# Patient Record
Sex: Female | Born: 1955 | Race: White | Hispanic: No | Marital: Married | Smoking: Never smoker
Health system: Southern US, Community
[De-identification: ages and names within clinical notes are randomized; demographics above are authoritative.]

## PROBLEM LIST (undated history)

## (undated) DIAGNOSIS — F329 Major depressive disorder, single episode, unspecified: Secondary | ICD-10-CM

## (undated) DIAGNOSIS — F32A Depression, unspecified: Secondary | ICD-10-CM

## (undated) HISTORY — PX: TONSILLECTOMY: SUR1361

## (undated) HISTORY — PX: WISDOM TOOTH EXTRACTION: SHX21

## (undated) HISTORY — PX: OOPHORECTOMY: SHX86

---

## 1998-04-04 ENCOUNTER — Encounter: Payer: Self-pay | Admitting: Internal Medicine

## 2003-02-08 ENCOUNTER — Other Ambulatory Visit: Admission: RE | Admit: 2003-02-08 | Discharge: 2003-02-08 | Payer: Self-pay | Admitting: Internal Medicine

## 2004-12-24 ENCOUNTER — Ambulatory Visit: Payer: Self-pay | Admitting: Internal Medicine

## 2005-01-26 ENCOUNTER — Ambulatory Visit: Payer: Self-pay | Admitting: Internal Medicine

## 2005-06-25 ENCOUNTER — Ambulatory Visit: Payer: Self-pay | Admitting: Internal Medicine

## 2005-12-23 ENCOUNTER — Other Ambulatory Visit: Admission: RE | Admit: 2005-12-23 | Discharge: 2005-12-23 | Payer: Self-pay | Admitting: Internal Medicine

## 2005-12-23 ENCOUNTER — Ambulatory Visit: Payer: Self-pay | Admitting: Internal Medicine

## 2005-12-31 ENCOUNTER — Encounter: Admission: RE | Admit: 2005-12-31 | Discharge: 2005-12-31 | Payer: Self-pay | Admitting: Internal Medicine

## 2006-01-22 ENCOUNTER — Ambulatory Visit: Payer: Self-pay | Admitting: Internal Medicine

## 2006-03-05 ENCOUNTER — Ambulatory Visit: Payer: Self-pay | Admitting: Internal Medicine

## 2006-07-05 ENCOUNTER — Ambulatory Visit: Payer: Self-pay | Admitting: Internal Medicine

## 2006-09-09 ENCOUNTER — Ambulatory Visit: Payer: Self-pay | Admitting: Internal Medicine

## 2007-01-05 ENCOUNTER — Ambulatory Visit: Payer: Self-pay | Admitting: Internal Medicine

## 2007-01-05 ENCOUNTER — Encounter: Payer: Self-pay | Admitting: Internal Medicine

## 2007-01-05 DIAGNOSIS — D649 Anemia, unspecified: Secondary | ICD-10-CM

## 2007-01-05 DIAGNOSIS — K219 Gastro-esophageal reflux disease without esophagitis: Secondary | ICD-10-CM

## 2007-01-05 DIAGNOSIS — R002 Palpitations: Secondary | ICD-10-CM

## 2007-01-05 DIAGNOSIS — J309 Allergic rhinitis, unspecified: Secondary | ICD-10-CM | POA: Insufficient documentation

## 2007-01-05 DIAGNOSIS — F329 Major depressive disorder, single episode, unspecified: Secondary | ICD-10-CM

## 2007-01-05 LAB — CONVERTED CEMR LAB
ALT: 11 units/L (ref 0–40)
AST: 13 units/L (ref 0–37)
Alkaline Phosphatase: 63 units/L (ref 39–117)
Basophils Relative: 1 % (ref 0.0–1.0)
Bilirubin, Direct: 0.1 mg/dL (ref 0.0–0.3)
Chloride: 108 meq/L (ref 96–112)
Creatinine, Ser: 0.9 mg/dL (ref 0.4–1.2)
Eosinophils Relative: 2.7 % (ref 0.0–5.0)
GFR calc Af Amer: 86 mL/min
GFR calc non Af Amer: 71 mL/min
Lymphocytes Relative: 20.1 % (ref 12.0–46.0)
MCHC: 34.9 g/dL (ref 30.0–36.0)
MCV: 79.5 fL (ref 78.0–100.0)
Phosphorus: 3.3 mg/dL (ref 2.3–4.6)
Platelets: 294 10*3/uL (ref 150–400)
Potassium: 4.4 meq/L (ref 3.5–5.1)
RDW: 13.7 % (ref 11.5–14.6)
Total Bilirubin: 0.7 mg/dL (ref 0.3–1.2)
WBC: 6.6 10*3/uL (ref 4.5–10.5)

## 2007-01-06 ENCOUNTER — Encounter: Admission: RE | Admit: 2007-01-06 | Discharge: 2007-01-06 | Payer: Self-pay | Admitting: Internal Medicine

## 2007-03-18 ENCOUNTER — Telehealth: Payer: Self-pay | Admitting: Internal Medicine

## 2007-06-13 ENCOUNTER — Encounter: Payer: Self-pay | Admitting: Internal Medicine

## 2007-07-07 ENCOUNTER — Ambulatory Visit: Payer: Self-pay | Admitting: Internal Medicine

## 2007-07-07 DIAGNOSIS — R5381 Other malaise: Secondary | ICD-10-CM

## 2007-07-07 DIAGNOSIS — J019 Acute sinusitis, unspecified: Secondary | ICD-10-CM

## 2007-07-07 DIAGNOSIS — R5383 Other fatigue: Secondary | ICD-10-CM

## 2007-07-08 ENCOUNTER — Ambulatory Visit: Payer: Self-pay | Admitting: Oncology

## 2007-07-08 LAB — CONVERTED CEMR LAB
Basophils Absolute: 0 10*3/uL (ref 0.0–0.1)
Basophils Relative: 0.6 % (ref 0.0–1.0)
CO2: 28 meq/L (ref 19–32)
Calcium: 8.9 mg/dL (ref 8.4–10.5)
Eosinophils Absolute: 0.1 10*3/uL (ref 0.0–0.6)
Eosinophils Relative: 2.5 % (ref 0.0–5.0)
Ferritin: 4 ng/mL — ABNORMAL LOW (ref 10.0–291.0)
GFR calc Af Amer: 98 mL/min
HCT: 32.6 % — ABNORMAL LOW (ref 36.0–46.0)
Hemoglobin: 10.9 g/dL — ABNORMAL LOW (ref 12.0–15.0)
Iron: 32 ug/dL — ABNORMAL LOW (ref 42–145)
Monocytes Absolute: 0.4 10*3/uL (ref 0.2–0.7)
Phosphorus: 3.5 mg/dL (ref 2.3–4.6)
Potassium: 4.3 meq/L (ref 3.5–5.1)
RBC: 4.18 M/uL (ref 3.87–5.11)
Sodium: 141 meq/L (ref 135–145)
TSH: 0.82 microintl units/mL (ref 0.35–5.50)
Total Protein: 6.9 g/dL (ref 6.0–8.3)
Transferrin: 289.3 mg/dL (ref >=212.0)
WBC: 5.9 10*3/uL (ref 4.5–10.5)

## 2007-07-25 ENCOUNTER — Encounter: Payer: Self-pay | Admitting: Internal Medicine

## 2007-07-25 LAB — CBC WITH DIFFERENTIAL (CANCER CENTER ONLY)
BASO%: 0.3 % (ref 0.0–2.0)
EOS%: 3.3 % (ref 0.0–7.0)
LYMPH#: 1.5 10*3/uL (ref 0.9–3.3)
LYMPH%: 27 % (ref 14.0–48.0)
MCHC: 32.9 g/dL (ref 32.0–36.0)
MONO#: 0.3 10*3/uL (ref 0.1–0.9)
NEUT%: 64.1 % (ref 39.6–80.0)
Platelets: 347 10*3/uL (ref 145–400)
RDW: 14.8 % — ABNORMAL HIGH (ref 10.5–14.6)
WBC: 5.4 10*3/uL (ref 3.9–10.0)

## 2007-07-25 LAB — COMPREHENSIVE METABOLIC PANEL
ALT: 12 U/L (ref 0–35)
AST: 13 U/L (ref 0–37)
Albumin: 3.8 g/dL (ref 3.5–5.2)
Alkaline Phosphatase: 59 U/L (ref 39–117)
BUN: 8 mg/dL (ref 6–23)
CO2: 27 mEq/L (ref 19–32)
Calcium: 8.9 mg/dL (ref 8.4–10.5)
Chloride: 108 mEq/L (ref 96–112)
Creatinine, Ser: 0.83 mg/dL (ref 0.40–1.20)
Glucose, Bld: 85 mg/dL (ref 70–99)
Potassium: 4.1 mEq/L (ref 3.5–5.3)
Sodium: 138 mEq/L (ref 135–145)
Total Bilirubin: 0.4 mg/dL (ref 0.3–1.2)
Total Protein: 6.6 g/dL (ref 6.0–8.3)

## 2007-07-25 LAB — MORPHOLOGY - CHCC SATELLITE
PLT EST ~~LOC~~: ADEQUATE
Platelet Morphology: NORMAL

## 2007-07-25 LAB — RETICULOCYTES (CHCC)
ABS Retic: 35 10*3/uL (ref 19.0–186.0)
RBC.: 4.37 MIL/uL (ref 3.87–5.11)
Retic Ct Pct: 0.8 % (ref 0.4–3.1)

## 2007-07-26 ENCOUNTER — Other Ambulatory Visit: Admission: RE | Admit: 2007-07-26 | Discharge: 2007-07-26 | Payer: Self-pay | Admitting: Obstetrics and Gynecology

## 2007-07-27 LAB — IRON AND TIBC
%SAT: 6 % — ABNORMAL LOW (ref 20–55)
Iron: 24 ug/dL — ABNORMAL LOW (ref 42–145)
TIBC: 400 ug/dL (ref 250–470)

## 2007-07-27 LAB — PROTEIN ELECTROPHORESIS, SERUM
Albumin ELP: 56.9 % (ref 55.8–66.1)
Alpha-2-Globulin: 10.7 % (ref 7.1–11.8)
Beta Globulin: 6.5 % (ref 4.7–7.2)
Gamma Globulin: 16.6 % (ref 11.1–18.8)
Total Protein, Serum Electrophoresis: 7.3 g/dL (ref 6.0–8.3)

## 2007-07-27 LAB — VITAMIN B12: Vitamin B-12: 278 pg/mL (ref 211–911)

## 2007-07-27 LAB — FERRITIN: Ferritin: 5 ng/mL — ABNORMAL LOW (ref 10–291)

## 2007-07-27 LAB — HEMOGLOBINOPATHY EVALUATION
Hemoglobin Other: 0 % (ref 0.0–0.0)
Hgb A2 Quant: 3 % (ref 2.2–3.2)
Hgb A: 97 % (ref 96.8–97.8)
Hgb S Quant: 0 % (ref 0.0–0.0)

## 2007-07-27 LAB — ERYTHROPOIETIN: Erythropoietin: 19.4 m[IU]/mL (ref 2.6–34.0)

## 2007-07-29 ENCOUNTER — Encounter: Payer: Self-pay | Admitting: Internal Medicine

## 2007-08-08 LAB — CBC WITH DIFFERENTIAL (CANCER CENTER ONLY)
BASO%: 0.4 % (ref 0.0–2.0)
EOS%: 3 % (ref 0.0–7.0)
HCT: 31.6 % — ABNORMAL LOW (ref 34.8–46.6)
LYMPH#: 1.5 10*3/uL (ref 0.9–3.3)
MCHC: 33.4 g/dL (ref 32.0–36.0)
MONO#: 0.4 10*3/uL (ref 0.1–0.9)
NEUT#: 4.5 10*3/uL (ref 1.5–6.5)
NEUT%: 68.7 % (ref 39.6–80.0)
RDW: 14 % (ref 10.5–14.6)
WBC: 6.5 10*3/uL (ref 3.9–10.0)

## 2007-08-19 ENCOUNTER — Ambulatory Visit: Payer: Self-pay | Admitting: Oncology

## 2007-09-20 ENCOUNTER — Encounter: Payer: Self-pay | Admitting: Internal Medicine

## 2007-09-20 LAB — IRON AND TIBC
%SAT: 30 % (ref 20–55)
Iron: 88 ug/dL (ref 42–145)
UIBC: 206 ug/dL

## 2007-09-20 LAB — RETICULOCYTES (CHCC)
ABS Retic: 39.4 10*3/uL (ref 19.0–186.0)
RBC.: 4.92 MIL/uL (ref 3.87–5.11)
Retic Ct Pct: 0.8 % (ref 0.4–3.1)

## 2007-09-20 LAB — CBC WITH DIFFERENTIAL (CANCER CENTER ONLY)
BASO%: 0.3 % (ref 0.0–2.0)
HCT: 38.2 % (ref 34.8–46.6)
LYMPH%: 23.7 % (ref 14.0–48.0)
MCH: 27.8 pg (ref 26.0–34.0)
MCHC: 34.3 g/dL (ref 32.0–36.0)
MCV: 81 fL (ref 81–101)
MONO%: 5.5 % (ref 0.0–13.0)
NEUT%: 67.6 % (ref 39.6–80.0)
Platelets: 320 10*3/uL (ref 145–400)
RDW: 19.7 % — ABNORMAL HIGH (ref 10.5–14.6)

## 2007-12-22 ENCOUNTER — Ambulatory Visit: Payer: Self-pay | Admitting: Oncology

## 2007-12-27 ENCOUNTER — Encounter: Payer: Self-pay | Admitting: Internal Medicine

## 2007-12-27 LAB — CBC WITH DIFFERENTIAL (CANCER CENTER ONLY)
BASO#: 0 10*3/uL (ref 0.0–0.2)
BASO%: 0.4 % (ref 0.0–2.0)
EOS%: 2.9 % (ref 0.0–7.0)
HCT: 37.8 % (ref 34.8–46.6)
LYMPH#: 1.1 10*3/uL (ref 0.9–3.3)
MCH: 29.3 pg (ref 26.0–34.0)
MCV: 86 fL (ref 81–101)
NEUT#: 3.4 10*3/uL (ref 1.5–6.5)
NEUT%: 69.5 % (ref 39.6–80.0)
RBC: 4.38 10*6/uL (ref 3.70–5.32)
WBC: 4.8 10*3/uL (ref 3.9–10.0)

## 2007-12-27 LAB — IRON AND TIBC
Iron: 101 ug/dL (ref 42–145)
TIBC: 304 ug/dL (ref 250–470)
UIBC: 203 ug/dL

## 2007-12-27 LAB — RETICULOCYTES (CHCC)
ABS Retic: 44 10*3/uL (ref 19.0–186.0)
RBC.: 4.4 MIL/uL (ref 3.87–5.11)
Retic Ct Pct: 1 % (ref 0.4–3.1)

## 2008-03-16 ENCOUNTER — Ambulatory Visit: Payer: Self-pay | Admitting: Oncology

## 2008-03-22 LAB — CBC WITH DIFFERENTIAL (CANCER CENTER ONLY)
EOS%: 3.2 % (ref 0.0–7.0)
Eosinophils Absolute: 0.2 10*3/uL (ref 0.0–0.5)
HCT: 36.9 % (ref 34.8–46.6)
LYMPH%: 22.6 % (ref 14.0–48.0)
MCHC: 33.7 g/dL (ref 32.0–36.0)
MONO#: 0.4 10*3/uL (ref 0.1–0.9)
MONO%: 6.3 % (ref 0.0–13.0)
NEUT#: 3.7 10*3/uL (ref 1.5–6.5)
Platelets: 308 10*3/uL (ref 145–400)
WBC: 5.5 10*3/uL (ref 3.9–10.0)

## 2008-03-22 LAB — IRON AND TIBC
%SAT: 13 % — ABNORMAL LOW (ref 20–55)
Iron: 41 ug/dL — ABNORMAL LOW (ref 42–145)
TIBC: 318 ug/dL (ref 250–470)

## 2008-03-22 LAB — FERRITIN: Ferritin: 15 ng/mL (ref 10–291)

## 2008-03-27 ENCOUNTER — Encounter: Payer: Self-pay | Admitting: Internal Medicine

## 2008-04-09 ENCOUNTER — Telehealth (INDEPENDENT_AMBULATORY_CARE_PROVIDER_SITE_OTHER): Payer: Self-pay | Admitting: *Deleted

## 2008-04-18 ENCOUNTER — Encounter: Payer: Self-pay | Admitting: Internal Medicine

## 2008-04-27 LAB — CBC WITH DIFFERENTIAL (CANCER CENTER ONLY)
BASO%: 0.4 % (ref 0.0–2.0)
HGB: 12 g/dL (ref 11.6–15.9)
LYMPH%: 21 % (ref 14.0–48.0)
MCHC: 34.2 g/dL (ref 32.0–36.0)
MONO#: 0.3 10*3/uL (ref 0.1–0.9)
NEUT%: 70.8 % (ref 39.6–80.0)
RBC: 4.21 10*6/uL (ref 3.70–5.32)
RDW: 12.7 % (ref 10.5–14.6)
WBC: 5.9 10*3/uL (ref 3.9–10.0)

## 2008-04-27 LAB — FERRITIN: Ferritin: 10 ng/mL (ref 10–291)

## 2008-04-27 LAB — IRON AND TIBC
%SAT: 17 % — ABNORMAL LOW (ref 20–55)
UIBC: 268 ug/dL

## 2008-05-09 ENCOUNTER — Telehealth: Payer: Self-pay | Admitting: Internal Medicine

## 2008-06-19 ENCOUNTER — Ambulatory Visit: Payer: Self-pay | Admitting: Oncology

## 2008-06-25 ENCOUNTER — Encounter: Payer: Self-pay | Admitting: Internal Medicine

## 2008-06-25 LAB — CBC WITH DIFFERENTIAL (CANCER CENTER ONLY)
BASO#: 0 10*3/uL (ref 0.0–0.2)
BASO%: 0.3 % (ref 0.0–2.0)
EOS%: 3.1 % (ref 0.0–7.0)
Eosinophils Absolute: 0.2 10*3/uL (ref 0.0–0.5)
LYMPH#: 1.2 10*3/uL (ref 0.9–3.3)
LYMPH%: 21.8 % (ref 14.0–48.0)
MCHC: 34.2 g/dL (ref 32.0–36.0)
MONO#: 0.3 10*3/uL (ref 0.1–0.9)
MONO%: 5.5 % (ref 0.0–13.0)
NEUT#: 3.9 10*3/uL (ref 1.5–6.5)
NEUT%: 69.3 % (ref 39.6–80.0)
Platelets: 244 10*3/uL (ref 145–400)
RDW: 12.4 % (ref 10.5–14.6)

## 2008-06-25 LAB — IRON AND TIBC: UIBC: 275 ug/dL

## 2008-09-06 ENCOUNTER — Ambulatory Visit: Payer: Self-pay | Admitting: Oncology

## 2008-09-07 LAB — CBC WITH DIFFERENTIAL (CANCER CENTER ONLY)
Eosinophils Absolute: 0.2 10*3/uL (ref 0.0–0.5)
HCT: 34.1 % — ABNORMAL LOW (ref 34.8–46.6)
HGB: 11.2 g/dL — ABNORMAL LOW (ref 11.6–15.9)
LYMPH#: 1.1 10*3/uL (ref 0.9–3.3)
LYMPH%: 21.7 % (ref 14.0–48.0)
MONO%: 5.7 % (ref 0.0–13.0)
NEUT#: 3.6 10*3/uL (ref 1.5–6.5)
NEUT%: 68.7 % (ref 39.6–80.0)
Platelets: 337 10*3/uL (ref 145–400)
RDW: 13 % (ref 10.5–14.6)

## 2008-09-07 LAB — IRON AND TIBC
Iron: 152 ug/dL — ABNORMAL HIGH (ref 42–145)
TIBC: 365 ug/dL (ref 250–470)

## 2008-10-01 ENCOUNTER — Encounter (INDEPENDENT_AMBULATORY_CARE_PROVIDER_SITE_OTHER): Payer: Self-pay | Admitting: Obstetrics and Gynecology

## 2008-10-01 ENCOUNTER — Ambulatory Visit (HOSPITAL_COMMUNITY): Admission: RE | Admit: 2008-10-01 | Discharge: 2008-10-01 | Payer: Self-pay | Admitting: Obstetrics and Gynecology

## 2008-10-09 ENCOUNTER — Encounter: Payer: Self-pay | Admitting: Internal Medicine

## 2008-10-22 ENCOUNTER — Telehealth: Payer: Self-pay | Admitting: Internal Medicine

## 2008-10-30 ENCOUNTER — Ambulatory Visit: Payer: Self-pay | Admitting: Internal Medicine

## 2008-12-25 ENCOUNTER — Telehealth: Payer: Self-pay | Admitting: Internal Medicine

## 2009-01-03 ENCOUNTER — Ambulatory Visit: Payer: Self-pay | Admitting: Oncology

## 2009-01-07 ENCOUNTER — Encounter: Payer: Self-pay | Admitting: Internal Medicine

## 2009-01-07 LAB — CBC WITH DIFFERENTIAL (CANCER CENTER ONLY)
BASO#: 0.1 10*3/uL (ref 0.0–0.2)
BASO%: 0.7 % (ref 0.0–2.0)
HGB: 12.9 g/dL (ref 11.6–15.9)
LYMPH%: 23.8 % (ref 14.0–48.0)
MCHC: 34.1 g/dL (ref 32.0–36.0)
MONO#: 0.4 10*3/uL (ref 0.1–0.9)
NEUT#: 4.5 10*3/uL (ref 1.5–6.5)
RDW: 13.7 % (ref 10.5–14.6)

## 2009-01-07 LAB — IRON AND TIBC
TIBC: 318 ug/dL (ref 250–470)
UIBC: 247 ug/dL

## 2009-02-28 ENCOUNTER — Ambulatory Visit: Payer: Self-pay | Admitting: Internal Medicine

## 2009-03-12 ENCOUNTER — Ambulatory Visit: Payer: Self-pay | Admitting: Internal Medicine

## 2009-03-13 LAB — CONVERTED CEMR LAB
HDL: 59.2 mg/dL (ref >39.00)
Total CHOL/HDL Ratio: 3

## 2010-04-24 ENCOUNTER — Telehealth: Payer: Self-pay | Admitting: Family Medicine

## 2010-10-21 NOTE — Progress Notes (Signed)
Summary: Rx Prozac  Phone Note Refill Request Call back at [phone] Message from:  [facility] on April 24, 2010 11:17 AM  Refills Requested: Medication #1:  PROZAC 20 MG  CAPS Take one by mouth once a day   Last Refilled: 03/25/2010 Patient has not been seen in over a year, no appointment scheduled. Pharmacy has been notified on the last 2 refill request that patient needs to be seen for further refills.   Method Requested: Electronic Initial call taken by: Sydell Axon LPN,  April 24, 2010 11:19 AM  Follow-up for Phone Call        denied.  Needs to be seen. Ruthe Mannan MD  April 24, 2010 11:23 AM   Additional Follow-up for Phone Call Additional follow up Details #1::        Pharmacy notified as instructed. Additional Follow-up by: Sydell Axon LPN,  April 24, 2010 11:53 AM

## 2011-01-05 LAB — CBC
HCT: 40 % (ref 36.0–46.0)
Hemoglobin: 13 g/dL (ref 12.0–15.0)
MCV: 85 fL (ref 78.0–100.0)
RBC: 4.7 MIL/uL (ref 3.87–5.11)

## 2011-01-05 LAB — PREGNANCY, URINE: Preg Test, Ur: NEGATIVE

## 2011-02-03 NOTE — Op Note (Signed)
NAME:  Ashley Little, Ashley Little                ACCOUNT NO.:  [id number]   MEDICAL RECORD NO.:  [id number]          PATIENT TYPE:  AMB   LOCATION:  SDC                           FACILITY:  [facility]   PHYSICIAN:  Fermin Schwab, MD   DATE OF BIRTH:  19-Apr-1956   DATE OF PROCEDURE:  DATE OF DISCHARGE:  10/01/2008                               OPERATIVE REPORT   PREOPERATIVE DIAGNOSIS:  Submucosal myoma, menometrorrhagia.   POSTOPERATIVE DIAGNOSIS:  Large type 1 submucosal myoma,  menometrorrhagia.   PROCEDURES:  Hysteroscopy, resection of submucosal myoma, suction  dilatation and curettage.   SURGEON:  Fermin Schwab, MD   ANESTHESIA:  General endotracheal.   ESTIMATED BLOOD LOSS:  Less than 20 mL.   FLUID DEFICIT:  A 375 mL of 3% sorbitol.   FINDINGS:  Uterus sounded to 9 cm.  Endocervical canal and visible  portion of the endometrial cavity appeared normal.  There was menstrual  endometrium in the cavity.  Initially, a fundal myoma was noted to be  filling up to about two-thirds of the endometrial cavity.  As the  decompression of the cavity occurred with the continued resection, the  ostial regions of the cavity could be visualized.  The 2.8 x 27 cm type  1 submucosal myoma was noted to be attached to the fundal and posterior  aspect of the cavity.   DESCRIPTION OF PROCEDURE:  The patient was placed in dorsal supine  position.  General endotracheal anesthesia was given.  She was prepped  and draped in lithotomy position.  A 2 g of cefazolin was given  intravenously for prophylaxis.  The bladder was catheterized.  A vaginal  speculum was inserted and dilute vasopressin solution (0.4 units/mL) was  injected x8 mL.  First, a 3% solution of sorbitol via a dedicated  hysteroscopic fluid management system was used for distention.  Initially, a slender GYN hysteroscope was used for the diagnostic  hysteroscopy and above findings were noted.  Because of fluffy nature of  the menstrual  endometrium, it was necessary to perform suction D&C for  better evaluation of submucosal myoma to be resected.  This was done  with a size 8 curette.  The cervix was then dilated to 29-French with  Hegar dilators.  First, the slender hysteroscope, then the standard 9-mm  hysteroscope was used.  Using 90 watts cutting current on a loop  electrode, the myoma was carefully resected.  As the cavity was crowded  with fragments of fibroids, these were evacuated and the rest of the  myoma was shaved down to the condition that was level with rest of the  endometrium.  Now, the fundal endometrium could be better visualized and  the tubal ostia was visualized at this point for the first time.   Hemostasis was checked.  The instruments count was correct.  All  instruments were removed.  The patient tolerated the procedure well and  was transferred to PACU in satisfactory condition.      Fermin Schwab, MD  Electronically Signed     TY/MEDQ  D:  10/03/2008  T:  10/04/2008  Job:  [id number]   cc:   Artist Pais, M.D.  Fax: [phone]

## 2016-05-11 ENCOUNTER — Other Ambulatory Visit: Payer: Self-pay | Admitting: Internal Medicine

## 2016-05-11 DIAGNOSIS — Z1231 Encounter for screening mammogram for malignant neoplasm of breast: Secondary | ICD-10-CM

## 2016-05-15 ENCOUNTER — Ambulatory Visit
Admission: RE | Admit: 2016-05-15 | Discharge: 2016-05-15 | Disposition: A | Discharge status: Home / Self Care | Payer: No Typology Code available for payment source | Source: Ambulatory Visit | Attending: Internal Medicine | Admitting: Internal Medicine

## 2016-05-15 DIAGNOSIS — Z1231 Encounter for screening mammogram for malignant neoplasm of breast: Secondary | ICD-10-CM

## 2017-02-02 ENCOUNTER — Ambulatory Visit (INDEPENDENT_AMBULATORY_CARE_PROVIDER_SITE_OTHER): Payer: Self-pay

## 2017-02-02 ENCOUNTER — Encounter (HOSPITAL_COMMUNITY): Payer: Self-pay | Admitting: Emergency Medicine

## 2017-02-02 ENCOUNTER — Ambulatory Visit (HOSPITAL_COMMUNITY)
Admission: EM | Admit: 2017-02-02 | Discharge: 2017-02-02 | Disposition: A | Discharge status: Home / Self Care | Payer: No Typology Code available for payment source

## 2017-02-02 DIAGNOSIS — M25572 Pain in left ankle and joints of left foot: Secondary | ICD-10-CM

## 2017-02-02 DIAGNOSIS — S93602A Unspecified sprain of left foot, initial encounter: Secondary | ICD-10-CM

## 2017-02-02 HISTORY — DX: Depression, unspecified: F32.A

## 2017-02-02 HISTORY — DX: Major depressive disorder, single episode, unspecified: F32.9

## 2017-02-02 NOTE — Discharge Instructions (Addendum)
Rest,ice,ace wrap, elevate,crutches. Your xrays were negative for fracture. Please follow up with Orthopedics if pain/swelling persist. May take ibuprofen 400 mg every 8 hours x 3 days for pain/swelling.

## 2017-02-02 NOTE — ED Triage Notes (Signed)
[facility] a [facility] of left ankle pain and swelling x 3 weeks. The patient denied any known injury.

## 2017-02-02 NOTE — ED Provider Notes (Signed)
CSN: [id number]     Arrival date & time 02/02/17  1738 History   None    Chief Complaint  Patient presents with  . Ankle Pain   (Consider location/radiation/quality/duration/timing/severity/associated sxs/prior Treatment) The history is provided by the patient. No language interpreter was used.  Ankle Pain  Location:  Ankle and foot Time since incident:  3 weeks Lower extremity injury: unknown, "heard a pop" after rotating foot.   Ankle location:  L ankle Foot location:  L foot Pain details:    Quality:  Aching, pressure and throbbing   Radiates to:  Does not radiate   Severity:  Moderate   Onset quality:  Unable to specify   Timing:  Intermittent   Progression:  Waxing and waning Chronicity:  New Dislocation: no   Foreign body present:  No foreign bodies Tetanus status:  Up to date Prior injury to area:  No Relieved by:  Ice, rest and elevation Worsened by:  Activity Associated symptoms: swelling   Associated symptoms: no fever     Past Medical History:  Diagnosis Date  . Depression    Past Surgical History:  Procedure Laterality Date  . OOPHORECTOMY Right    History reviewed. No pertinent family history. Social History  Substance Use Topics  . Smoking status: Never Smoker  . Smokeless tobacco: Never Used  . Alcohol use No   OB History    No data available     Review of Systems  Constitutional: Negative for fever.  Musculoskeletal: Positive for arthralgias and joint swelling.  All other systems reviewed and are negative.   Allergies  Sulfonamide derivatives  Home Medications   Prior to Admission medications   Medication Sig Start Date End Date Taking? Authorizing Provider  FLUoxetine (PROZAC) 10 MG tablet Take 10 mg by mouth daily.    [provider]   Meds Ordered and Administered this Visit  Medications - No data to display  BP 127/67 (BP Location: Right Arm)   Pulse 77   Temp 98.7 F (37.1 C) (Oral)   Resp 18   SpO2 97%  No data  found.   Physical Exam  Constitutional: She is oriented to person, place, and time. Vital signs are normal. She appears well-developed and well-nourished. She is active and cooperative.  Non-toxic appearance. She does not have a sickly appearance. She does not appear ill. No distress.  HENT:  Head: Normocephalic.  Right Ear: Tympanic membrane normal.  Left Ear: Tympanic membrane normal.  Nose: Nose normal.  Mouth/Throat: Uvula is midline and mucous membranes are normal.  Eyes: Pupils are equal, round, and reactive to light.  Neck: Trachea normal and normal range of motion. Muscular tenderness present. No Brudzinski's sign and no Kernig's sign noted.  Cardiovascular: Normal rate and regular rhythm.   Pulses:      Dorsalis pedis pulses are 2+ on the right side, and 2+ on the left side.  Pulmonary/Chest: Effort normal and breath sounds normal.  Musculoskeletal:       Right shoulder: She exhibits normal range of motion, no tenderness, no bony tenderness, no swelling, no effusion, no crepitus, no deformity, no laceration, no pain, normal pulse and normal strength.       Left ankle: She exhibits swelling. She exhibits normal pulse. Tenderness. Lateral malleolus tenderness found.       Left foot: There is tenderness, bony tenderness and swelling. There is normal range of motion, normal capillary refill, no crepitus, no deformity and no laceration.  Neurological: She is  alert and oriented to person, place, and time. No cranial nerve deficit or sensory deficit. GCS eye subscore is 4. GCS verbal subscore is 5. GCS motor subscore is 6.  Skin: Skin is warm and dry. No rash noted.  Psychiatric: She has a normal mood and affect. Her speech is normal and behavior is normal.  Nursing note and vitals reviewed.   Urgent Care Course     Procedures (including critical care time)  Labs Review Labs Reviewed - No data to display  Imaging Review Dg Ankle Complete Left  Result Date: 02/02/2017 CLINICAL  DATA:  Left foot and ankle pain and swelling for 1 month. No reported injury. EXAM: LEFT ANKLE COMPLETE - 3+ VIEW COMPARISON:  None. FINDINGS: Mild diffuse left ankle soft tissue swelling. No fracture or subluxation. No suspicious focal osseous lesion. Small Achilles and moderate plantar left calcaneal spurs. No significant arthropathy. No radiopaque foreign body. IMPRESSION: 1. Mild diffuse left ankle soft tissue swelling. No fracture or subluxation in the left ankle. 2. Small Achilles and moderate plantar left calcaneal spurs. Electronically Signed   By: Delbert PhenixJason A Poff M.D.   On: 02/02/2017 18:30   Dg Foot Complete Left  Result Date: 02/02/2017 CLINICAL DATA:  Left foot and ankle pain and swelling for 1 month. No reported injury. EXAM: LEFT FOOT - COMPLETE 3+ VIEW COMPARISON:  None. FINDINGS: Cerclage wire overlies the proximal aspect of the proximal phalanx in the left first toe. Mild hallux valgus deformity. Mild well corticated deformity in the medial distal left first metatarsal with overlying soft tissue swelling suggests postsurgical changes from prior bunionectomy. No fracture or dislocation. No suspicious focal osseous lesion. 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 Small Achilles and moderate plantar left calcaneal spurs. IMPRESSION: 1. No left foot fracture or dislocation . 2. Mild hallux valgus deformity. Well corticated deformity in the medial distal left first metatarsal with overlying soft tissue swelling, correlate for history of prior bunionectomy. Electronically Signed   By: Delbert PhenixJason A Poff M.D.   On: 02/02/2017 18:32         MDM   1. Acute left ankle pain   2. Sprain of left foot, initial encounter      1810: left foot/ankle xray ordered  Rest,ice,ace wrap, elevate,crutches. Your xrays were negative for fracture. Please follow up with Orthopedics if pain/swelling persist. May take ibuprofen 400 mg every 8 hours x 3 days for pain/swelling. Pt verbalized  understanding to this provider. Ace wrap applied in UC, NV intact pre/post application, crutches with instructions given.    Clancy Gourdefelice, Eshaan Titzer, NP 02/02/17 [phone]

## 2018-06-30 ENCOUNTER — Encounter (HOSPITAL_COMMUNITY): Payer: Self-pay | Admitting: Emergency Medicine

## 2018-06-30 ENCOUNTER — Ambulatory Visit (HOSPITAL_COMMUNITY)
Admission: EM | Admit: 2018-06-30 | Discharge: 2018-06-30 | Disposition: A | Discharge status: Home / Self Care | Payer: Managed Care, Other (non HMO) | Attending: Family Medicine | Admitting: Family Medicine

## 2018-06-30 DIAGNOSIS — R079 Chest pain, unspecified: Secondary | ICD-10-CM | POA: Diagnosis not present

## 2018-06-30 MED ORDER — OMEPRAZOLE 20 MG PO CPDR: 20.0000 mg | DELAYED_RELEASE_CAPSULE | Freq: Every day | ORAL | 0 refills | Status: AC

## 2018-06-30 NOTE — ED Provider Notes (Addendum)
[facility]    CSN: [id number] Arrival date & time: 06/30/18  1303     History   Chief Complaint Chief Complaint  Patient presents with  . Chest Pain    HPI Ashley Little is a 62 y.o. female history of depression presenting today for evaluation of chest pain.  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  Feels like it "seized up".  Symptoms lasted for approximately 5 minutes.  Symptoms did take her breath away and felt short of breath.  Symptoms resolved and patient declines current chest pain.  She denies associated nausea or vomiting.  Symptoms happened after eating dinner.  Denies persistent shortness of breath.  Denies left arm pain, numbness or jaw pain.  Denies dizziness or lightheadedness.  Denies leg pain or leg swelling.  Denies previous DVT/PE.  Denies history of hypertension, DM, tobacco use.  Patient was recently diagnosed with hyperlipidemia, but notes that this happened after doing the keto diet for many months.  Patient does note to have a history of reflux.  Denies coughing or fevers.  HPI  Past Medical History:  Diagnosis Date  . Depression     Patient Active Problem List   Diagnosis Date Noted  . SINUSITIS- ACUTE-NOS 07/07/2007  . FATIGUE 07/07/2007  . ANEMIA-NOS 01/05/2007  . DEPRESSION 01/05/2007  . ALLERGIC RHINITIS 01/05/2007  . GERD 01/05/2007  . PALPITATIONS 01/05/2007    Past Surgical History:  Procedure Laterality Date  . OOPHORECTOMY Right     OB History   None      Home Medications    Prior to Admission medications   Medication Sig Start Date End Date Taking? Authorizing Provider  FLUoxetine (PROZAC) 10 MG tablet Take 10 mg by mouth daily.   Yes [provider]  omeprazole (PRILOSEC) 20 MG capsule Take 1 capsule (20 mg total) by mouth daily for 14 days. 06/30/18 07/14/18  [place] W[place]all, Junius Creamer, PA-C    Family History No family history on  file.  Social History Social History   Tobacco Use  . Smoking status: Never Smoker  . Smokeless tobacco: Never Used  Substance Use Topics  . Alcohol use: No  . Drug use: No     Allergies   Sulfonamide derivatives   Review of Systems Review of Systems  Constitutional: Negative for activity change, appetite change, fatigue and fever.  HENT: Negative for congestion, sinus pressure and sore throat.   Eyes: Negative for photophobia, pain and visual disturbance.  Respiratory: Negative for cough and shortness of breath.   Cardiovascular: Positive for chest pain.  Gastrointestinal: Negative for abdominal pain, diarrhea, nausea and vomiting.  Genitourinary: Negative for decreased urine volume.  Musculoskeletal: Negative for myalgias, neck pain and neck stiffness.  Neurological: Positive for headaches. Negative for dizziness, syncope, facial asymmetry, speech difficulty, weakness, light-headedness and numbness.     Physical Exam Triage Vital Signs ED Triage Vitals  Enc Vitals Group     BP 06/30/18 1328 129/79     Pulse Rate 06/30/18 1326 69     Resp 06/30/18 1326 16     Temp 06/30/18 1326 98.1 F (36.7 C)     Temp Source 06/30/18 1326 Oral     SpO2 06/30/18 1326 100 %     Weight --      Height --      Head Circumference --      Peak Flow --  Pain Score 06/30/18 1329 3     Pain Loc --      Pain Edu? --      Excl. in GC? --    No data found.  Updated Vital Signs BP 129/79   Pulse 69   Temp 98.1 F (36.7 C) (Oral)   Resp 16   SpO2 100%   Visual Acuity Right Eye Distance:   Left Eye Distance:   Bilateral Distance:    Right Eye Near:   Left Eye Near:    Bilateral Near:     Physical Exam  Constitutional: She appears well-developed and well-nourished. No distress.  No acute distress  HENT:  Head: Normocephalic and atraumatic.  Mouth/Throat: Oropharynx is clear and moist.  Bilateral ears without tenderness to palpation of external auricle, tragus and  mastoid, EAC's without erythema or swelling, TM's with good bony landmarks and cone of light. Non erythematous.  Oral mucosa pink and moist, no tonsillar enlargement or exudate. Posterior pharynx patent and nonerythematous, no uvula deviation or swelling. Normal phonation.  Eyes: Pupils are equal, round, and reactive to light. Conjunctivae and EOM are normal.  Neck: Normal range of motion. Neck supple.  Cardiovascular: Normal rate and regular rhythm.  No murmur heard. Pulmonary/Chest: Effort normal and breath sounds normal. No respiratory distress.  Breathing comfortably at rest, CTABL, no wheezing, rales or other adventitious sounds auscultated  Chest discomfort not reproducible with palpation of anterior chest and around sternum  Abdominal: Soft. There is no tenderness.  Nontender to light deep palpation throughout all 4 quadrants of abdomen  Musculoskeletal: She exhibits no edema.  No lower extremity swelling or erythema, negative Homans bilaterally  Neurological: She is alert.  Skin: Skin is warm and dry.  Psychiatric: She has a normal mood and affect.  Nursing note and vitals reviewed.    UC Treatments / Results  Labs (all labs ordered are listed, but only abnormal results are displayed) Labs Reviewed - No data to display  EKG None  Radiology No results found.  Procedures Procedures (including critical care time)  Medications Ordered in UC Medications - No data to display  Initial Impression / Assessment and Plan / UC Course  I have reviewed the triage vital signs and the nursing notes.  Pertinent labs & imaging results that were available during my care of the patient were reviewed by me and considered in my medical decision making (see chart for details).     Patient with a 5-minute episode of chest pain that is resolved.  EKG normal sinus rhythm, no sign of ischemia or infarction.  Not currently with discomfort, negative risk factors for MI/DVT.  Symptoms most  likely reflux given history, will do trial of Prilosec daily, advised to go to emergency room if developing chest pain again.  Discussed with patient and her husband that EKG does not definitively rule out that this was not cardiac related.  Patient needs to go to emergency room for further work-up if symptoms returning.Discussed strict return precautions. Patient verbalized understanding and is agreeable with plan.  Final Clinical Impressions(s) / UC Diagnoses   Final diagnoses:  Chest pain, unspecified type     Discharge Instructions     Your EKG looks normal Your symptoms seem most likely related to reflux, please begin taking Prilosec daily for the next 1 to 2 weeks to see if this improves her symptoms at all If you start to have chest pain again, worsening symptoms, shortness of breath please go to emergency room  for further evaluation and work-up of this Follow-up with your primary in a couple weeks after trial of reflux medicine   ED Prescriptions    Medication Sig Dispense Auth. Provider   omeprazole (PRILOSEC) 20 MG capsule Take 1 capsule (20 mg total) by mouth daily for 14 days. 30 capsule Mohannad Olivero C, PA-C     Controlled Substance Prescriptions [place] Controlled Substance Registry consulted? Not Applicable   Lew Dawes, PA-C 06/30/18 1507    Willaim Mode, [place] C, [place] 06/30/18 1508

## 2018-06-30 NOTE — ED Triage Notes (Signed)
Pt reports an episode of chest discomfort with an "odd: sensation in neck and down both arms. PT was ironing at the time.   Pt reports some ongoing chest discomfort today. No cardiac history.

## 2018-06-30 NOTE — Discharge Instructions (Signed)
Your EKG looks normal Your symptoms seem most likely related to reflux, please begin taking Prilosec daily for the next 1 to 2 weeks to see if this improves her symptoms at all If you start to have chest pain again, worsening symptoms, shortness of breath please go to emergency room for further evaluation and work-up of this Follow-up with your primary in a couple weeks after trial of reflux medicine

## 2018-09-18 ENCOUNTER — Ambulatory Visit (HOSPITAL_COMMUNITY)
Admission: EM | Admit: 2018-09-18 | Discharge: 2018-09-18 | Disposition: A | Discharge status: Home / Self Care | Payer: Managed Care, Other (non HMO) | Attending: Family Medicine | Admitting: Family Medicine

## 2018-09-18 ENCOUNTER — Encounter (HOSPITAL_COMMUNITY): Payer: Self-pay | Admitting: *Deleted

## 2018-09-18 ENCOUNTER — Other Ambulatory Visit: Payer: Self-pay

## 2018-09-18 DIAGNOSIS — J020 Streptococcal pharyngitis: Secondary | ICD-10-CM | POA: Insufficient documentation

## 2018-09-18 LAB — POCT RAPID STREP A: Streptococcus, Group A Screen (Direct): POSITIVE — AB

## 2018-09-18 MED ORDER — PENICILLIN V POTASSIUM 500 MG PO TABS: 500.0000 mg | ORAL_TABLET | Freq: Two times a day (BID) | ORAL | 0 refills | Status: AC

## 2018-09-18 NOTE — ED Triage Notes (Signed)
C/O sore throat without fever since 12/26

## 2018-09-18 NOTE — Discharge Instructions (Signed)
It was nice seeing you today. Your strep test came back positive. We will treat with antibiotics. Please use Tylenol as needed for pain. See us as needed.

## 2018-09-18 NOTE — ED Provider Notes (Signed)
[facility]    CSN: [id number] Arrival date & time: 09/18/18  1413     History   Chief Complaint Chief Complaint  Patient presents with  . Sore Throat    HPI Ashley Little is a 62 y.o. female.   The history is provided by the patient. No language interpreter was used.  Sore Throat  This is a new problem. The current episode started more than 2 days ago (total of 3 days). The problem occurs constantly. The problem has been gradually worsening. Pertinent negatives include no chest pain, no abdominal pain, no headaches and no shortness of breath. Associated symptoms comments: She can feel glands swollen, she also had difficulty with swallowing which started today. She has a lot of nasal drainage. Nothing aggravates the symptoms. Nothing relieves the symptoms. The treatment provided mild (Dayquil, Nyquil, Sudafed) relief.  At christmas time she was around sick people.   Past Medical History:  Diagnosis Date  . Depression     Patient Active Problem List   Diagnosis Date Noted  . SINUSITIS- ACUTE-NOS 07/07/2007  . FATIGUE 07/07/2007  . ANEMIA-NOS 01/05/2007  . DEPRESSION 01/05/2007  . ALLERGIC RHINITIS 01/05/2007  . GERD 01/05/2007  . PALPITATIONS 01/05/2007    Past Surgical History:  Procedure Laterality Date  . OOPHORECTOMY Right   . TONSILLECTOMY    . WISDOM TOOTH EXTRACTION      OB History   No obstetric history on file.      Home Medications    Prior to Admission medications   Medication Sig Start Date End Date Taking? Authorizing Provider  FLUoxetine (PROZAC) 10 MG tablet Take 10 mg by mouth daily.   Yes [provider]  omeprazole (PRILOSEC) 20 MG capsule Take 1 capsule (20 mg total) by mouth daily for 14 days. 06/30/18 07/14/18  Wieters, Junius CreamerHallie C, PA-C    Family History Family History  Problem Relation Age of Onset  . Diabetes Mother   . Hypertension Mother   . Cancer Father   . Hypertension Father     Social  History Social History   Tobacco Use  . Smoking status: Never Smoker  . Smokeless tobacco: Never Used  Substance Use Topics  . Alcohol use: No  . Drug use: No     Allergies   Sulfonamide derivatives   Review of Systems Review of Systems  HENT: Positive for congestion, sore throat and trouble swallowing. Negative for ear discharge and ear pain.   Respiratory: Negative.  Negative for shortness of breath.   Cardiovascular: Negative for chest pain.  Gastrointestinal: Negative for abdominal pain.  Neurological: Negative for headaches.  All other systems reviewed and are negative.    Physical Exam Triage Vital Signs ED Triage Vitals  Enc Vitals Group     BP 09/18/18 1539 122/70     Pulse Rate 09/18/18 1539 69     Resp 09/18/18 1539 16     Temp 09/18/18 1539 98 F (36.7 C)     Temp Source 09/18/18 1539 Oral     SpO2 09/18/18 1539 100 %     Weight --      Height --      Head Circumference --      Peak Flow --      Pain Score 09/18/18 1541 8     Pain Loc --      Pain Edu? --      Excl. in GC? --    No data found.  Updated Vital  Signs BP 122/70   Pulse 69   Temp 98 F (36.7 C) (Oral)   Resp 16   SpO2 100%   Visual Acuity Right Eye Distance:   Left Eye Distance:   Bilateral Distance:    Right Eye Near:   Left Eye Near:    Bilateral Near:     Physical Exam Vitals signs and nursing note reviewed.  Constitutional:      General: She is not in acute distress.    Appearance: She is not toxic-appearing.  HENT:     Right Ear: Tympanic membrane and ear canal normal. No drainage.     Left Ear: Tympanic membrane and ear canal normal. No drainage.     Mouth/Throat:     Mouth: No oral lesions.     Pharynx: Posterior oropharyngeal erythema present. No pharyngeal swelling, oropharyngeal exudate or uvula swelling.     Tonsils: No tonsillar exudate or tonsillar abscesses.  Eyes:     Extraocular Movements:     Right eye: Normal extraocular motion.     Left eye:  Normal extraocular motion.     Conjunctiva/sclera: Conjunctivae normal.     Pupils: Pupils are equal, round, and reactive to light.  Neck:     Musculoskeletal: Normal range of motion and neck supple.     Thyroid: No thyromegaly.     Comments: Swollen and tender left anterior cervical lymph node Pulmonary:     Effort: Pulmonary effort is normal. No respiratory distress.     Breath sounds: Normal breath sounds. No wheezing, rhonchi or rales.  Abdominal:     General: Bowel sounds are normal. There is no distension.     Palpations: Abdomen is soft.  Lymphadenopathy:     Cervical: Cervical adenopathy present.  Neurological:     Mental Status: She is alert.      UC Treatments / Results  Labs (all labs ordered are listed, but only abnormal results are displayed) Labs Reviewed - No data to display  EKG None  Radiology No results found.  Procedures Procedures (including critical care time)  Medications Ordered in UC Medications - No data to display  Initial Impression / Assessment and Plan / UC Course  I have reviewed the triage vital signs and the nursing notes.  Pertinent labs & imaging results that were available during my care of the patient were reviewed by me and considered in my medical decision making (see chart for details).  Clinical Course as of Sep 18 1606  Sun Sep 18, 2018  1606 Strep pharyngitis. + Rapid strep. Treat with penicillin x 10 days. Return precaution discussed. Tylenol as needed for pain.   [KE]    Clinical Course User Index [KE] Doreene ElandEniola, Harlon Kutner T, MD    Strep throat   Final Clinical Impressions(s) / UC Diagnoses   Final diagnoses:  None   Discharge Instructions   None    ED Prescriptions    None     Controlled Substance Prescriptions [place] Controlled Substance Registry consulted? Not Applicable   Doreene ElandEniola, Luceal Hollibaugh T, MD 09/18/18 [phone]

## 2020-07-11 ENCOUNTER — Other Ambulatory Visit: Payer: Self-pay | Admitting: Internal Medicine

## 2020-07-11 DIAGNOSIS — Z1231 Encounter for screening mammogram for malignant neoplasm of breast: Secondary | ICD-10-CM

## 2020-08-09 ENCOUNTER — Other Ambulatory Visit: Payer: Self-pay

## 2020-08-09 ENCOUNTER — Ambulatory Visit
Admission: RE | Admit: 2020-08-09 | Discharge: 2020-08-09 | Disposition: A | Discharge status: Home / Self Care | Payer: Managed Care, Other (non HMO) | Source: Ambulatory Visit | Attending: Internal Medicine | Admitting: Internal Medicine

## 2020-08-09 DIAGNOSIS — Z1231 Encounter for screening mammogram for malignant neoplasm of breast: Secondary | ICD-10-CM

## 2021-06-26 ENCOUNTER — Other Ambulatory Visit: Payer: Self-pay | Admitting: Internal Medicine

## 2021-06-26 DIAGNOSIS — Z1231 Encounter for screening mammogram for malignant neoplasm of breast: Secondary | ICD-10-CM

## 2021-08-11 ENCOUNTER — Other Ambulatory Visit: Payer: Self-pay

## 2021-08-11 ENCOUNTER — Ambulatory Visit
Admission: RE | Admit: 2021-08-11 | Discharge: 2021-08-11 | Disposition: A | Discharge status: Home / Self Care | Payer: Managed Care, Other (non HMO) | Source: Ambulatory Visit | Attending: Internal Medicine | Admitting: Internal Medicine

## 2021-08-11 DIAGNOSIS — Z1231 Encounter for screening mammogram for malignant neoplasm of breast: Secondary | ICD-10-CM

## 2021-08-15 ENCOUNTER — Ambulatory Visit (HOSPITAL_COMMUNITY)
Admission: EM | Admit: 2021-08-15 | Discharge: 2021-08-15 | Disposition: A | Discharge status: Home / Self Care | Payer: Managed Care, Other (non HMO) | Attending: Emergency Medicine | Admitting: Emergency Medicine

## 2021-08-15 ENCOUNTER — Other Ambulatory Visit: Payer: Self-pay

## 2021-08-15 ENCOUNTER — Encounter (HOSPITAL_COMMUNITY): Payer: Self-pay | Admitting: Emergency Medicine

## 2021-08-15 DIAGNOSIS — J029 Acute pharyngitis, unspecified: Secondary | ICD-10-CM | POA: Insufficient documentation

## 2021-08-15 DIAGNOSIS — B9689 Other specified bacterial agents as the cause of diseases classified elsewhere: Secondary | ICD-10-CM | POA: Diagnosis present

## 2021-08-15 DIAGNOSIS — J028 Acute pharyngitis due to other specified organisms: Secondary | ICD-10-CM | POA: Diagnosis present

## 2021-08-15 LAB — POCT RAPID STREP A, ED / UC: Streptococcus, Group A Screen (Direct): NEGATIVE

## 2021-08-15 LAB — POC INFLUENZA A AND B ANTIGEN (URGENT CARE ONLY)
INFLUENZA A ANTIGEN, POC: NEGATIVE
INFLUENZA B ANTIGEN, POC: NEGATIVE

## 2021-08-15 MED ORDER — PENICILLIN V POTASSIUM 500 MG PO TABS: 500.0000 mg | ORAL_TABLET | Freq: Three times a day (TID) | ORAL | 0 refills | Status: AC

## 2021-08-15 NOTE — ED Triage Notes (Signed)
Pt reports started yesterday with scratchy throat and then last night started closing up. Reports when this happened last year was strep throat.

## 2021-08-15 NOTE — Discharge Instructions (Addendum)
Your rapid strep and rapid flu tests today were negative.  Based on my physical exam today and the history that you provided, I believe that you have bacterial pharyngitis.  Bacterial pharyngitis is most commonly caused by bacteria called group A Streptococcus but there are many other culprits.    The rapid strep test that we performed in the office only catches 40% of cases of group A streptococcal pharyngitis.  Additionally, and unfortunately, the throat culture that we perform here in the urgent care only evaluates for group A strep and not any of the other bacteria  that are known to cause bacterial pharyngitis.  I have prescribed you an antibiotic called cefdinir to treat you for bacterial pharyngitis.  Please take 1 tablet twice daily for the next 10 days.  Once you have been on cefdinir for a full 24 hours, you are no longer considered contagious.  If you receive a phone call telling you that your throat culture is negative, please keep in mind that it is only negative for group A strep and that they did not test for any other bacteria that cause bacterial pharyngitis.  For this reason, I strongly recommend that you complete the entire 10 days of antibiotics regardless of the result of a throat culture, particularly if you begin to feel better within the first 48 hours of therapy.  Please follow-up with your primary care provider in the next week to 10 days for repeat evaluation.  Please follow-up within the next 3 to 5 days either with your primary care provider or urgent care if your symptoms do not resolve.  If you do not have a primary care provider, we will assist you in finding one.

## 2021-08-15 NOTE — ED Provider Notes (Signed)
[facility]    CSN: [id number] Arrival date & time: 08/15/21  0865    HISTORY  No chief complaint on file.  HPI Ashley Little is a 65 y.o. female. Patient reports a history of streptococcal pharyngitis last year.  Patient states that yesterday she began to have a scratchy throat and then felt like it began to close up last night.  Patient states the symptoms are consistent with her previous streptococcal pharyngitis infection.  Vital signs are normal on arrival today.  Patient states she spends a lot more time around her grandchildren recently, states she usually gets sick around the holidays every year because of this.  Patient states that she is like there is something stuck in her throat.  Patient denies cough, congestion, headache, nausea, vomiting, diarrhea, body ache.  The history is provided by the patient.  Past Medical History:  Diagnosis Date   Depression    Patient Active Problem List   Diagnosis Date Noted   SINUSITIS- ACUTE-NOS 07/07/2007   FATIGUE 07/07/2007   ANEMIA-NOS 01/05/2007   DEPRESSION 01/05/2007   ALLERGIC RHINITIS 01/05/2007   GERD 01/05/2007   PALPITATIONS 01/05/2007   Past Surgical History:  Procedure Laterality Date   OOPHORECTOMY Right    TONSILLECTOMY     WISDOM TOOTH EXTRACTION     OB History   No obstetric history on file.    Home Medications    Prior to Admission medications   Medication Sig Start Date End Date Taking? Authorizing Provider  FLUoxetine (PROZAC) 10 MG tablet Take 10 mg by mouth daily.    [provider]  omeprazole (PRILOSEC) 20 MG capsule Take 1 capsule (20 mg total) by mouth daily for 14 days. 06/30/18 07/14/18  Wieters, Junius Creamer, PA-C   Family History Family History  Problem Relation Age of Onset   Diabetes Mother    Hypertension Mother    Cancer Father    Hypertension Father    Social History Social History   Tobacco Use   Smoking status: Never   Smokeless tobacco: Never  Vaping Use    Vaping Use: Never used  Substance Use Topics   Alcohol use: No   Drug use: No   Allergies   Sulfonamide derivatives  Review of Systems Review of Systems Pertinent findings noted in history of present illness.   Physical Exam Triage Vital Signs ED Triage Vitals  Enc Vitals Group     BP 07/18/21 0827 (!) 147/82     Pulse Rate 07/18/21 0827 72     Resp 07/18/21 0827 18     Temp 07/18/21 0827 98.3 F (36.8 C)     Temp Source 07/18/21 0827 Oral     SpO2 07/18/21 0827 98 %     Weight --      Height --      Head Circumference --      Peak Flow --      Pain Score 07/18/21 0826 5     Pain Loc --      Pain Edu? --      Excl. in GC? --   No data found.  Updated Vital Signs There were no vitals taken for this visit.  Physical Exam Vitals and nursing note reviewed.  Constitutional:      General: She is not in acute distress.    Appearance: Normal appearance. She is not ill-appearing.  HENT:     Head: Normocephalic and atraumatic.     Salivary Glands: Right salivary gland  is diffusely enlarged and tender. Left salivary gland is diffusely enlarged and tender.     Right Ear: Tympanic membrane, ear canal and external ear normal. No drainage. No middle ear effusion. There is no impacted cerumen. Tympanic membrane is not erythematous or bulging.     Left Ear: Tympanic membrane, ear canal and external ear normal. No drainage.  No middle ear effusion. There is no impacted cerumen. Tympanic membrane is not erythematous or bulging.     Nose: Nose normal. No nasal deformity, septal deviation, mucosal edema, congestion or rhinorrhea.     Right Turbinates: Not enlarged, swollen or pale.     Left Turbinates: Not enlarged, swollen or pale.     Right Sinus: No maxillary sinus tenderness or frontal sinus tenderness.     Left Sinus: No maxillary sinus tenderness or frontal sinus tenderness.     Mouth/Throat:     Lips: Pink. No lesions.     Mouth: Mucous membranes are moist. No oral lesions.      Pharynx: Oropharynx is clear. Uvula midline. Posterior oropharyngeal erythema present. No pharyngeal swelling or uvula swelling.     Tonsils: Tonsillar exudate present. 1+ on the right. 1+ on the left.  Eyes:     General: Lids are normal.        Right eye: No discharge.        Left eye: No discharge.     Extraocular Movements: Extraocular movements intact.     Conjunctiva/sclera: Conjunctivae normal.     Right eye: Right conjunctiva is not injected.     Left eye: Left conjunctiva is not injected.  Neck:     Trachea: Trachea and phonation normal.  Cardiovascular:     Rate and Rhythm: Normal rate and regular rhythm.     Pulses: Normal pulses.     Heart sounds: Normal heart sounds. No murmur heard.   No friction rub. No gallop.  Pulmonary:     Effort: Pulmonary effort is normal. No accessory muscle usage, prolonged expiration or respiratory distress.     Breath sounds: Normal breath sounds. No stridor, decreased air movement or transmitted upper airway sounds. No decreased breath sounds, wheezing, rhonchi or rales.  Chest:     Chest wall: No tenderness.  Musculoskeletal:        General: Normal range of motion.     Cervical back: Normal range of motion and neck supple. Normal range of motion.  Lymphadenopathy:     Cervical: Cervical adenopathy present.     Right cervical: Superficial cervical adenopathy present.     Left cervical: Superficial cervical adenopathy present.  Skin:    General: Skin is warm and dry.     Findings: No erythema or rash.  Neurological:     General: No focal deficit present.     Mental Status: She is alert and oriented to person, place, and time.  Psychiatric:        Mood and Affect: Mood normal.        Behavior: Behavior normal.    Visual Acuity Right Eye Distance:   Left Eye Distance:   Bilateral Distance:    Right Eye Near:   Left Eye Near:    Bilateral Near:     UC Couse / Diagnostics / Procedures:    EKG  Radiology No results  found.  Procedures Procedures (including critical care time)  UC Diagnoses / Final Clinical Impressions(s)    Final diagnoses:  None   I have reviewed the triage vital signs  and the nursing notes.  Pertinent labs & imaging results that were available during my care of the patient were reviewed by me and considered in my medical decision making (see chart for details).    Patient presents today with symptoms consistent with viral upper respiratory infection.  Rapid Strep test today was negative, given patient's history and physical exam findings I do believe would be appropriate to put her on antibiotics.  Provide her with prescription for penicillin.  Patient advised that she will be contacted with the results of her throat culture once received and that further recommendations to be provided at that time.  Influenza test today was negative.  Patient/parent/caregiver verbalized understanding and agreement of plan as discussed.  All questions were addressed during visit.  Please see discharge instructions below for further details of plan.  ED Prescriptions   None    PDMP not reviewed this encounter.  Pending results:  Labs Reviewed - No data to display   Medications Ordered in UC: Medications - No data to display  Discharge Instructions: Discharge Instructions   None     Disposition Upon Discharge:  Patient presented with an acute illness with associated systemic symptoms and significant discomfort requiring urgent management. In my opinion, this is a condition that a prudent lay person (someone who possesses an average knowledge of health and medicine) may potentially expect to result in complications if not addressed urgently such as respiratory distress, impairment of bodily function or dysfunction of bodily organs.   Routine symptom specific, illness specific and/or disease specific instructions were discussed with the patient and/or caregiver at length.   As such,  the patient has been evaluated and assessed, work-up was performed and treatment was provided in alignment with urgent care protocols and evidence based medicine.  Patient/parent/caregiver has been advised that the patient may require follow up for further testing and treatment if the symptoms continue in spite of treatment, as clinically indicated and appropriate.  The patient was tested for COVID-19, Influenza and/or RSV, then the patient/parent/guardian was advised to isolate at home pending the results of his/her diagnostic coronavirus test and potentially longer if they're positive. I have also advised pt that if his/her COVID-19 test returns positive, it's recommended to self-isolate for at least 10 days after symptoms first appeared AND until fever-free for 24 hours without fever reducer AND other symptoms have improved or resolved. Discussed self-isolation recommendations as well as instructions for household member/close contacts as per the [facility] and [place] [facility], and also gave patient the COVID packet with this information.  Patient/parent/caregiver has been advised to return to the [facility] or PCP in 3-5 days if no better; to PCP or the Emergency Department if new signs and symptoms develop, or if the current signs or symptoms continue to change or worsen for further workup, evaluation and treatment as clinically indicated and appropriate  The patient will follow up with their current PCP if and as advised. If the patient does not currently have a PCP we will assist them in obtaining one.   The patient may need specialty follow up if the symptoms continue, in spite of conservative treatment and management, for further workup, evaluation, consultation and treatment as clinically indicated and appropriate.  Condition: stable for discharge home Home: take medications as prescribed; routine discharge instructions as discussed; follow up as advised.    Theadora Rama Scales, PA-C 08/15/21 1014

## 2021-08-17 LAB — CULTURE, GROUP A STREP (THRC)

## 2022-11-10 ENCOUNTER — Other Ambulatory Visit: Payer: Self-pay | Admitting: Internal Medicine

## 2022-11-10 DIAGNOSIS — Z1231 Encounter for screening mammogram for malignant neoplasm of breast: Secondary | ICD-10-CM

## 2022-11-13 ENCOUNTER — Other Ambulatory Visit (HOSPITAL_COMMUNITY)
Admission: RE | Admit: 2022-11-13 | Discharge: 2022-11-13 | Disposition: A | Discharge status: Home / Self Care | Payer: Medicare Other | Source: Other Acute Inpatient Hospital | Attending: Ophthalmology | Admitting: Ophthalmology

## 2022-11-13 DIAGNOSIS — R519 Headache, unspecified: Secondary | ICD-10-CM | POA: Insufficient documentation

## 2022-11-13 LAB — CBC WITH DIFFERENTIAL/PLATELET
Abs Immature Granulocytes: 0.03 10*3/uL (ref 0.00–0.07)
Basophils Absolute: 0 10*3/uL (ref 0.0–0.1)
Basophils Relative: 1 %
Eosinophils Absolute: 0.1 10*3/uL (ref 0.0–0.5)
Eosinophils Relative: 2 %
HCT: 39 % (ref 36.0–46.0)
Hemoglobin: 13.1 g/dL (ref 12.0–15.0)
Immature Granulocytes: 0 %
Lymphocytes Relative: 21 %
Lymphs Abs: 1.5 10*3/uL (ref 0.7–4.0)
MCH: 29 pg (ref 26.0–34.0)
MCHC: 33.6 g/dL (ref 30.0–36.0)
MCV: 86.5 fL (ref 80.0–100.0)
Monocytes Absolute: 0.5 10*3/uL (ref 0.1–1.0)
Monocytes Relative: 6 %
Neutro Abs: 5 10*3/uL (ref 1.7–7.7)
Neutrophils Relative %: 70 %
Platelets: 309 10*3/uL (ref 150–400)
RBC: 4.51 MIL/uL (ref 3.87–5.11)
RDW: 12.6 % (ref 11.5–15.5)
WBC: 7.2 10*3/uL (ref 4.0–10.5)
nRBC: 0 % (ref 0.0–0.2)

## 2022-11-13 LAB — C-REACTIVE PROTEIN: CRP: 0.5 mg/dL (ref <1.0)

## 2022-11-13 LAB — SEDIMENTATION RATE: Sed Rate: 21 mm/hr (ref 0–22)

## 2023-03-20 IMAGING — MG MM DIGITAL SCREENING BILAT W/ TOMO AND CAD
8 series · 8 of 24 positions shown · non-contrast
Comparison: Previous exam(s).

ACR Breast Density Category a: The breast tissue is almost entirely
fatty.

CLINICAL DATA: Screening.

EXAM:
DIGITAL SCREENING BILATERAL MAMMOGRAM WITH TOMOSYNTHESIS AND CAD
TECHNIQUE: Bilateral screening digital craniocaudal and mediolateral oblique
mammograms were obtained. Bilateral screening digital breast
tomosynthesis was performed. The images were evaluated with
computer-aided detection.

[L MLO synth-2D]
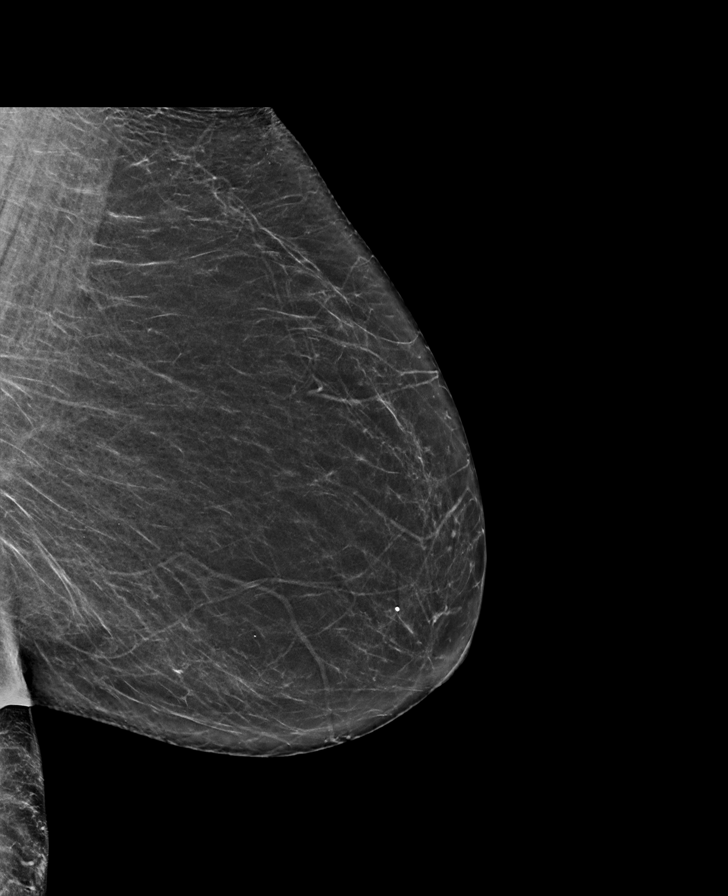

[R CC synth-2D]
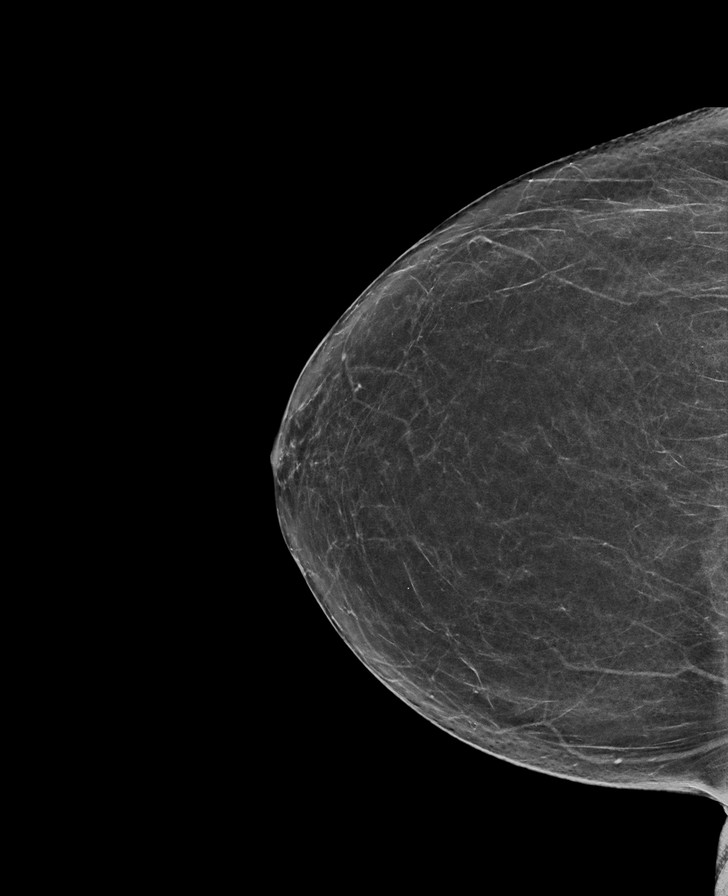

[L CC synth-2D]
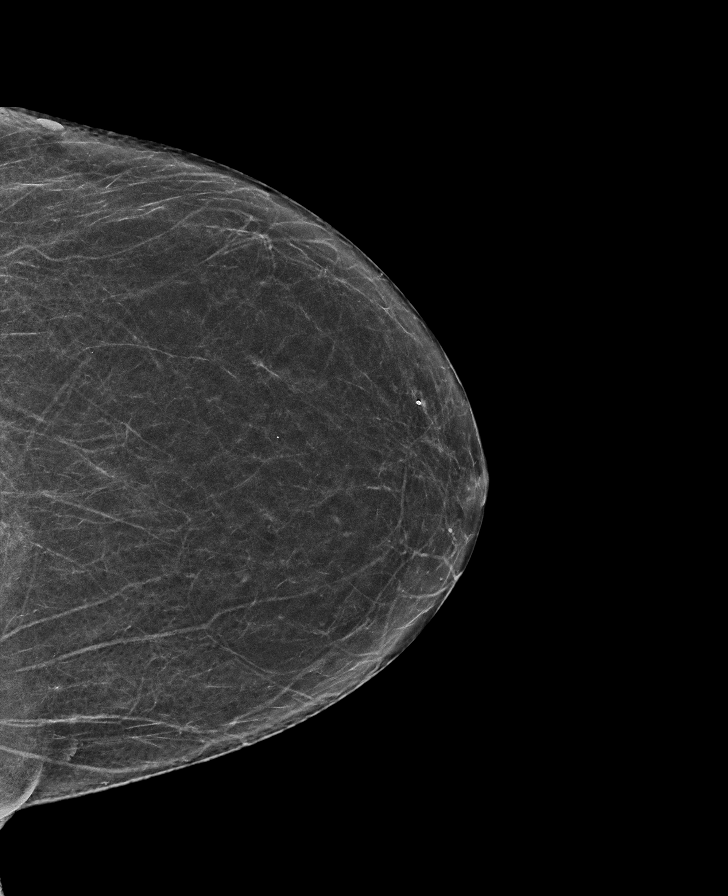

[R MLO synth-2D]
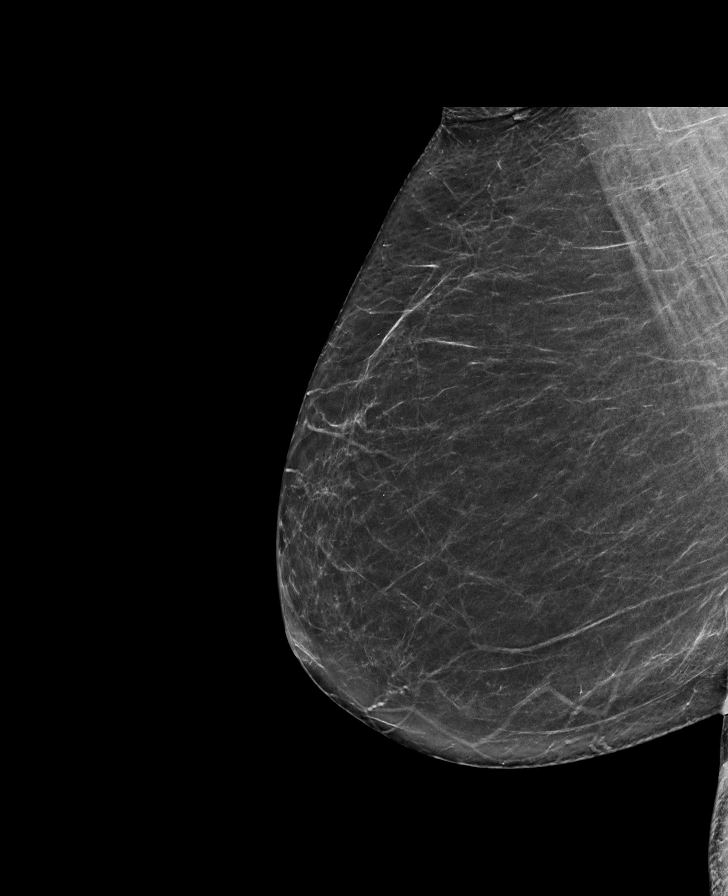

[L MLO tomo · tomo slice 34/67.0]
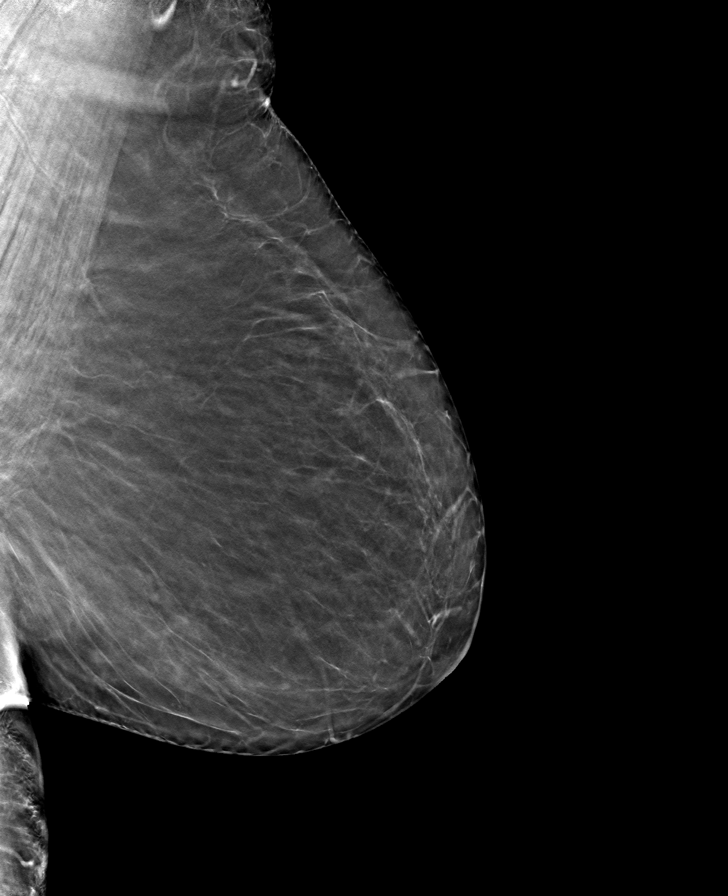

[L CC tomo · tomo slice 30/59.0]
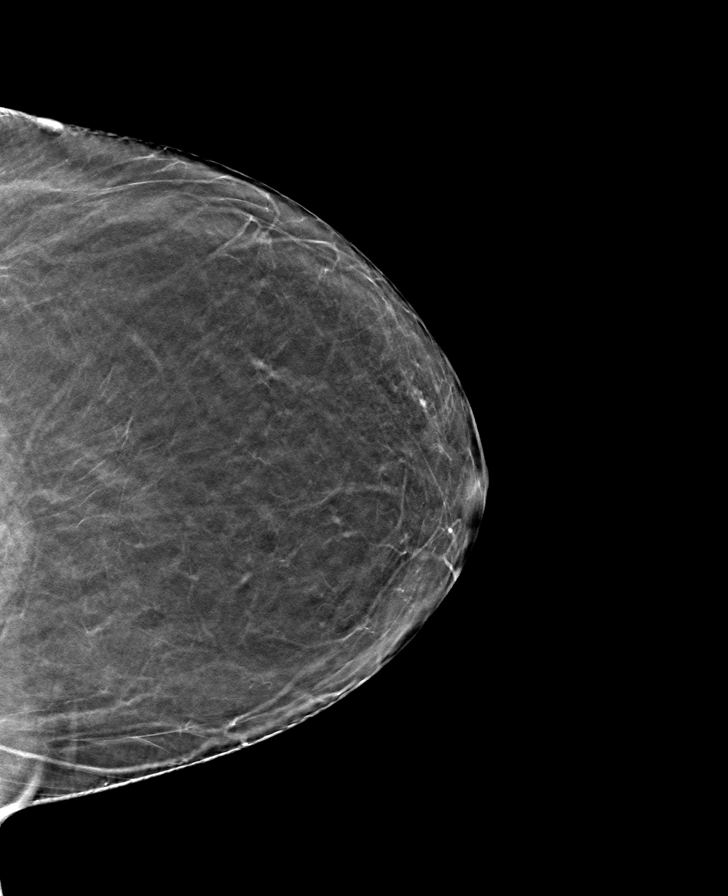

[R CC tomo · tomo slice 31/61.0]
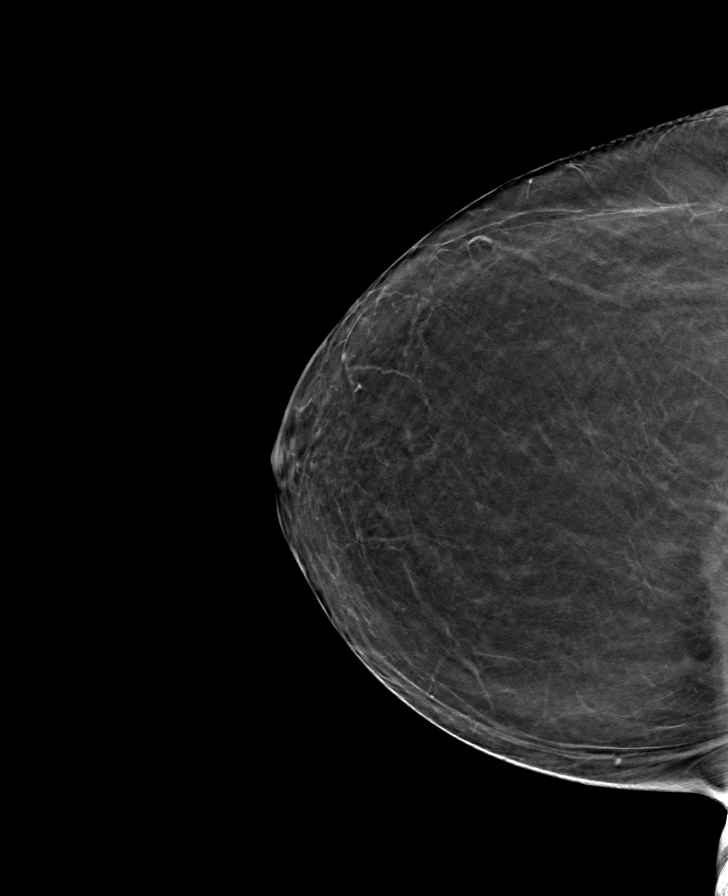

[R MLO tomo · tomo slice 35/68.0]
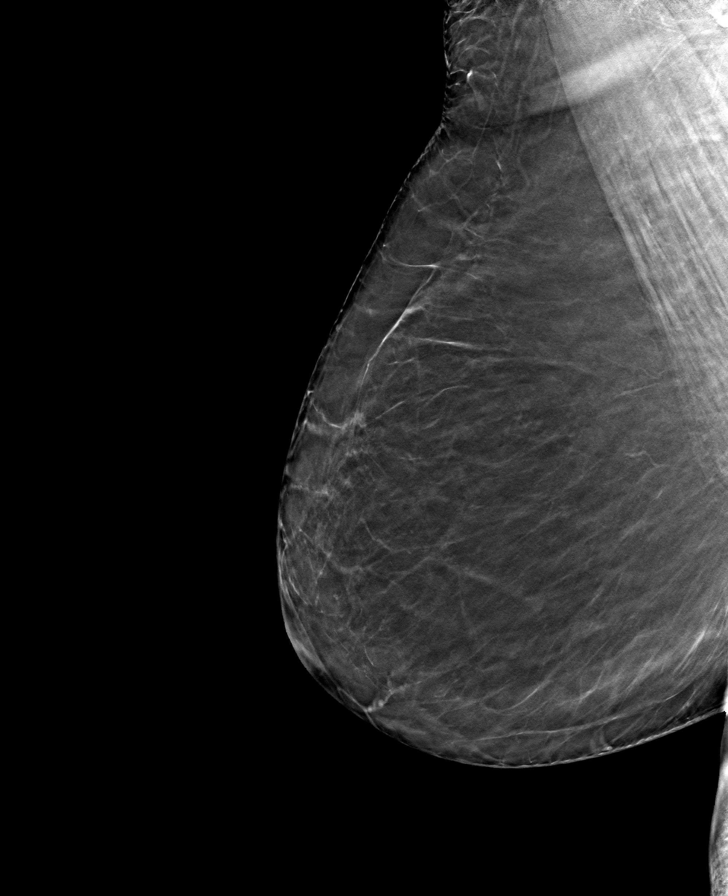

[8 of 24 positions shown; findings below may reference images not displayed]

FINDINGS: There are no findings suspicious for malignancy.
IMPRESSION: No mammographic evidence of malignancy. A result letter of this
screening mammogram will be mailed directly to the patient.

RECOMMENDATION:
Screening mammogram in one year. (Code:0E-3-N98)

BI-RADS CATEGORY  1: Negative.
